# Patient Record
Sex: Male | Born: 1985 | Race: White | Hispanic: No | Marital: Single | Smoking: Never smoker
Health system: Southern US, Community
[De-identification: ages and names within clinical notes are randomized; demographics above are authoritative.]

---

## 2008-01-09 ENCOUNTER — Emergency Department: Payer: Self-pay | Admitting: Emergency Medicine

## 2021-01-27 ENCOUNTER — Other Ambulatory Visit: Payer: Self-pay

## 2021-01-27 DIAGNOSIS — U071 COVID-19: Secondary | ICD-10-CM | POA: Insufficient documentation

## 2021-01-27 DIAGNOSIS — Z5321 Procedure and treatment not carried out due to patient leaving prior to being seen by health care provider: Secondary | ICD-10-CM | POA: Diagnosis not present

## 2021-01-27 DIAGNOSIS — R059 Cough, unspecified: Secondary | ICD-10-CM | POA: Diagnosis present

## 2021-01-27 LAB — RESP PANEL BY RT-PCR (FLU A&B, COVID) ARPGX2
Influenza A by PCR: NEGATIVE
Influenza B by PCR: NEGATIVE
SARS Coronavirus 2 by RT PCR: POSITIVE — AB

## 2021-01-27 NOTE — ED Triage Notes (Signed)
Chills, headache, vomit last night x1, 100.4 tmax. Tylenol taken and 'messing with ulcers.' Wife +covid wednesday

## 2021-01-28 ENCOUNTER — Emergency Department
Admission: EM | Admit: 2021-01-28 | Discharge: 2021-01-28 | Disposition: A | Discharge status: Home / Self Care | Payer: Medicaid - Out of State | Attending: Emergency Medicine | Admitting: Emergency Medicine

## 2021-01-28 NOTE — ED Notes (Signed)
Pt called for repeat VS with no answer.  

## 2021-01-29 ENCOUNTER — Other Ambulatory Visit: Payer: Self-pay

## 2021-01-29 ENCOUNTER — Emergency Department
Admission: EM | Admit: 2021-01-29 | Discharge: 2021-01-29 | Disposition: A | Discharge status: Home / Self Care | Payer: Medicaid - Out of State | Attending: Student in an Organized Health Care Education/Training Program | Admitting: Student in an Organized Health Care Education/Training Program

## 2021-01-29 DIAGNOSIS — U071 COVID-19: Secondary | ICD-10-CM

## 2021-01-29 DIAGNOSIS — R519 Headache, unspecified: Secondary | ICD-10-CM

## 2021-01-29 MED ORDER — DIPHENHYDRAMINE HCL 25 MG PO CAPS
25.0000 mg | ORAL_CAPSULE | Freq: Once | ORAL | Status: AC
  Administered 2021-01-29: 25 mg via ORAL
  Filled 2021-01-29: qty 1

## 2021-01-29 MED ORDER — METOCLOPRAMIDE HCL 10 MG PO TABS
10.0000 mg | ORAL_TABLET | Freq: Once | ORAL | Status: AC
  Administered 2021-01-29: 10 mg via ORAL
  Filled 2021-01-29: qty 1

## 2021-01-29 MED ORDER — DEXAMETHASONE SODIUM PHOSPHATE 10 MG/ML IJ SOLN
10.0000 mg | Freq: Once | INTRAMUSCULAR | Status: AC
  Administered 2021-01-29: 10 mg via INTRAMUSCULAR
  Filled 2021-01-29: qty 1

## 2021-01-29 MED ORDER — METOCLOPRAMIDE HCL 5 MG PO TABS: 5.0000 mg | ORAL_TABLET | Freq: Three times a day (TID) | ORAL | 0 refills | Status: DC | PRN

## 2021-01-29 MED ORDER — NIRMATRELVIR/RITONAVIR (PAXLOVID)TABLET: 3.0000 | ORAL_TABLET | Freq: Two times a day (BID) | ORAL | 0 refills | Status: AC

## 2021-01-29 NOTE — Discharge Instructions (Addendum)
Take the meds as prescribed. Follow-up with your provider or return if needed.

## 2021-01-29 NOTE — ED Triage Notes (Signed)
Pt states that he was seen here Friday for a head ache and LWBS- pt states he was tested for covid and never got his results- pt chart states his swab results were positive for covid- pt here for HA today as HA has not gotten better since Friday- pt states he took tylenol about 15 minutes prior to arrival

## 2021-01-31 NOTE — ED Provider Notes (Signed)
[facility] Emergency Department Provider Note ____________________________________________  Time seen: 2250  I have reviewed the triage vital signs and the nursing notes.  HISTORY  Chief Complaint  Covid Positive   HPI Philip Ellis is a 35 y.o. male presents with to the ED for evaluation of symptoms related to recently diagnosed COVID.  Patient was seen in the ED 2 days ago for headache, and left before being seen.  He apparently had a pending COVID test has been reported back as positive.  He presents today noting ongoing headache as well as some generalized malaise and body aches.  He denies any nausea, vomiting, chest pain, or shortness of breath.  History reviewed. No pertinent past medical history.  There are no problems to display for this patient.   History reviewed. No pertinent surgical history.  Prior to Admission medications   Medication Sig Start Date End Date Taking? Authorizing Provider  metoCLOPramide (REGLAN) 5 MG tablet Take 1 tablet (5 mg total) by mouth every 8 (eight) hours as needed for up to 5 days for nausea or vomiting. 01/29/21 02/03/21 Yes Jessina Marse, Charlesetta Ivory, PA-C  nirmatrelvir/ritonavir EUA (PAXLOVID) 20 x 150 MG & 10 x 100MG  TABS Take 3 tablets by mouth 2 (two) times daily for 5 days. Patient GFR is WNL. Take nirmatrelvir (150 mg) two tablets twice daily for 5 days and ritonavir (100 mg) one tablet twice daily for 5 days. 01/29/21 02/03/21 Yes Arline Ketter, 02/05/21, PA-C    Allergies Patient has no known allergies.  History reviewed. No pertinent family history.  Social History    Review of Systems  Constitutional: Negative for fever. Eyes: Negative for visual changes. ENT: Negative for sore throat. Cardiovascular: Negative for chest pain. Respiratory: Negative for shortness of breath. Gastrointestinal: Negative for abdominal pain, vomiting and diarrhea. Genitourinary: Negative for dysuria. Musculoskeletal: Negative  for back pain. Skin: Negative for rash. Neurological: Positive for headaches.  Denies focal weakness or numbness. ____________________________________________  PHYSICAL EXAM:  VITAL SIGNS: ED Triage Vitals  Enc Vitals Group     BP 01/29/21 1827 108/73     Pulse Rate 01/29/21 1827 68     Resp 01/29/21 1827 18     Temp 01/29/21 1827 98.7 F (37.1 C)     Temp Source 01/29/21 1827 Oral     SpO2 01/29/21 1827 99 %     Weight 01/29/21 1829 153 lb (69.4 kg)     Height 01/29/21 1829 6\' 3"  (1.905 m)     Head Circumference --      Peak Flow --      Pain Score 01/29/21 1829 7     Pain Loc --      Pain Edu? --      Excl. in GC? --     Constitutional: Alert and oriented. Well appearing and in no distress. Head: Normocephalic and atraumatic. Eyes: Conjunctivae are normal. PERRL. Normal extraocular movements Ears: Canals clear. TMs intact bilaterally. Nose: No congestion/rhinorrhea/epistaxis. Mouth/Throat: Mucous membranes are moist. Neck: Supple. No thyromegaly. Hematological/Lymphatic/Immunological: No cervical lymphadenopathy. Cardiovascular: Normal rate, regular rhythm. Normal distal pulses. Respiratory: Normal respiratory effort. No wheezes/rales/rhonchi. Gastrointestinal: Soft and nontender. No distention. Musculoskeletal: Nontender with normal range of motion in all extremities.  Neurologic:  Normal gait without ataxia. Normal speech and language. No gross focal neurologic deficits are appreciated. Skin:  Skin is warm, dry and intact. No rash noted. Psychiatric: Mood and affect are normal. Patient exhibits appropriate insight and judgment. ____________________________________________    {LABS (  pertinent positives/negatives) Labs Reviewed - No data to display  ____________________________________________  {EKG  ____________________________________________   RADIOLOGY Official radiology report(s): No results  found. ____________________________________________  PROCEDURES  Benadryl 25 mg p.o. Metoclopramide 10 mg p.o. Decadron IM 10 mg  Procedures ____________________________________________   INITIAL IMPRESSION / ASSESSMENT AND PLAN / ED COURSE  As part of my medical decision making, I reviewed the following data within the electronic MEDICAL RECORD NUMBER Labs reviewed as noted and Notes from prior ED visits   Patient recently confirmed COVID, presents with ongoing headache and malaise.  He is evaluated for his complaints, and found to have no acute findings on exam.  No respiratory distress, no dehydration, no toxic appearance.  No neuromuscular deficits on exam.  He is treated with antihistamines, nausea medicine, and steroid in the ED.  He is discharged with a prescription for Paxlovid to take as directed.  He will follow with primary provider return to the ED if needed.  Rockie Schnoor was evaluated in Emergency Department on 01/31/2021 for the symptoms described in the history of present illness. He was evaluated in the context of the global COVID-19 pandemic, which necessitated consideration that the patient might be at risk for infection with the SARS-CoV-2 virus that causes COVID-19. Institutional protocols and algorithms that pertain to the evaluation of patients at risk for COVID-19 are in a state of rapid change based on information released by regulatory bodies including the CDC and federal and state organizations. These policies and algorithms were followed during the patient's care in the ED. ____________________________________________  FINAL CLINICAL IMPRESSION(S) / ED DIAGNOSES  Final diagnoses:  COVID-19  Acute nonintractable headache, unspecified headache type      Lissa Hoard, PA-C 01/31/21 2035    Willy Eddy, MD 02/01/21 [phone]

## 2021-06-15 ENCOUNTER — Emergency Department
Admission: EM | Admit: 2021-06-15 | Discharge: 2021-06-15 | Disposition: A | Discharge status: Home / Self Care | Payer: Medicaid - Out of State | Attending: Emergency Medicine | Admitting: Emergency Medicine

## 2021-06-15 ENCOUNTER — Other Ambulatory Visit: Payer: Self-pay

## 2021-06-15 DIAGNOSIS — J069 Acute upper respiratory infection, unspecified: Secondary | ICD-10-CM | POA: Insufficient documentation

## 2021-06-15 DIAGNOSIS — Z20822 Contact with and (suspected) exposure to covid-19: Secondary | ICD-10-CM | POA: Insufficient documentation

## 2021-06-15 DIAGNOSIS — R059 Cough, unspecified: Secondary | ICD-10-CM | POA: Diagnosis present

## 2021-06-15 LAB — RESP PANEL BY RT-PCR (FLU A&B, COVID) ARPGX2
Influenza A by PCR: NEGATIVE
Influenza B by PCR: NEGATIVE
SARS Coronavirus 2 by RT PCR: NEGATIVE

## 2021-06-15 LAB — GROUP A STREP BY PCR: Group A Strep by PCR: NOT DETECTED

## 2021-06-15 MED ORDER — PSEUDOEPH-BROMPHEN-DM 30-2-10 MG/5ML PO SYRP: 5.0000 mL | ORAL_SOLUTION | Freq: Four times a day (QID) | ORAL | 0 refills | Status: DC | PRN

## 2021-06-15 MED ORDER — BENZONATATE 100 MG PO CAPS: ORAL_CAPSULE | ORAL | 0 refills | Status: DC

## 2021-06-15 NOTE — ED Triage Notes (Signed)
Pt c/o cough with congestion, runny nose, sore throat for the past 3 days, states he took a home covid test that was negative

## 2021-06-15 NOTE — ED Notes (Signed)
See triage note  presents with cough and congestion  sx's started a few days ago afebrile on arrival states home COVID was neg

## 2021-06-15 NOTE — Discharge Instructions (Addendum)
Your tests and exam are reassuring at this time.  Take the prescription meds as needed.  Follow with your primary provider or return to ED as needed.

## 2021-06-15 NOTE — ED Provider Notes (Signed)
[facility] Provider Note  Patient Contact: 10:58 AM (approximate)   History   URI   HPI  Philip Ellis is a 36 y.o. male presents to the ED for evaluation of cough, congestion, runny nose, sore throat for the last 3 days.  He took a home COVID test that was negative.     Physical Exam   Triage Vital Signs: ED Triage Vitals  Enc Vitals Group     BP 06/15/21 0905 (!) 131/93     Pulse Rate 06/15/21 0905 77     Resp 06/15/21 0905 18     Temp 06/15/21 0905 98.7 F (37.1 C)     Temp src --      SpO2 06/15/21 0905 98 %     Weight 06/15/21 0918 153 lb (69.4 kg)     Height 06/15/21 0918 6\' 3"  (1.905 m)     Head Circumference --      Peak Flow --      Pain Score --      Pain Loc --      Pain Edu? --      Excl. in GC? --     Most recent vital signs: Vitals:   06/15/21 0905  BP: (!) 131/93  Pulse: 77  Resp: 18  Temp: 98.7 F (37.1 C)  SpO2: 98%     General: Alert and in no acute distress. Head: No acute traumatic findings Ears: TMs clear bilaterally Nose: No congestion/rhinnorhea. Mouth/Throat: Mucous membranes are moist. Neck: No stridor. No cervical spine tenderness to palpation. Cardiovascular:  Good peripheral perfusion Respiratory: Normal respiratory effort without tachypnea or retractions. Lungs CTAB.  Musculoskeletal: Full range of motion to all extremities.  Neurologic:  No gross focal neurologic deficits are appreciated.  Skin:   No rash noted Other:   ED Results / Procedures / Treatments   Labs (all labs ordered are listed, but only abnormal results are displayed) Labs Reviewed  RESP PANEL BY RT-PCR (FLU A&B, COVID) [id number]  GROUP A STREP BY PCR     EKG    RADIOLOGY   No results found.  PROCEDURES:  Critical Care performed: No  Procedures   MEDICATIONS ORDERED IN ED: Medications - No data to display   IMPRESSION / MDM / ASSESSMENT AND PLAN / ED COURSE  I reviewed the triage vital signs and the  nursing notes.                              Differential diagnosis includes, but is not limited to, viral URI, strep pharyngitis, COVID, flu, AOM  Patient ED evaluation of symptoms including cough, congestion, and sore throat, presenting for evaluation.  He is evaluated in the ED with a reassuring exam shows no acute AOM or tonsillitis.  Labs including viral panel screen are negative at this time, and a prescription strep does not confirm strep pharyngitis.  Patient's diagnosis is consistent with viral URI with cough. Patient will be discharged home with prescriptions for Tessalon Perles and Bromfed syrup. Patient is to follow up with local urgent care as needed or otherwise directed. Patient is given ED precautions to return to the ED for any worsening or new symptoms.   FINAL CLINICAL IMPRESSION(S) / ED DIAGNOSES   Final diagnoses:  Viral URI with cough     Rx / DC Orders   ED Discharge Orders          Ordered  benzonatate (TESSALON PERLES) 100 MG capsule        06/15/21 1108    brompheniramine-pseudoephedrine-DM 30-2-10 MG/5ML syrup  4 times daily PRN        06/15/21 1108             Note:  This document was prepared using Dragon voice recognition software and may include unintentional dictation errors.    Lissa Hoard, PA-C 06/17/21 2309    Minna Antis, MD 06/19/21 [phone]

## 2021-08-16 ENCOUNTER — Other Ambulatory Visit: Payer: Self-pay

## 2021-08-16 ENCOUNTER — Emergency Department
Admission: EM | Admit: 2021-08-16 | Discharge: 2021-08-16 | Disposition: A | Discharge status: Home / Self Care | Payer: Medicaid - Out of State | Attending: Student in an Organized Health Care Education/Training Program | Admitting: Student in an Organized Health Care Education/Training Program

## 2021-08-16 ENCOUNTER — Encounter: Payer: Self-pay | Admitting: Emergency Medicine

## 2021-08-16 ENCOUNTER — Emergency Department: Payer: Medicaid - Out of State

## 2021-08-16 DIAGNOSIS — W228XXA Striking against or struck by other objects, initial encounter: Secondary | ICD-10-CM | POA: Insufficient documentation

## 2021-08-16 DIAGNOSIS — S60221A Contusion of right hand, initial encounter: Secondary | ICD-10-CM | POA: Diagnosis not present

## 2021-08-16 DIAGNOSIS — S6991XA Unspecified injury of right wrist, hand and finger(s), initial encounter: Secondary | ICD-10-CM | POA: Diagnosis present

## 2021-08-16 MED ORDER — IBUPROFEN 800 MG PO TABS: 800.0000 mg | ORAL_TABLET | Freq: Three times a day (TID) | ORAL | 0 refills | Status: AC | PRN

## 2021-08-16 NOTE — ED Triage Notes (Signed)
Pt comes into the ED via POV c/o right hand pain after the wrench slipped and he hit it on the transmission.  Pt in NAD at this time.  ?

## 2021-08-16 NOTE — Discharge Instructions (Addendum)
primary care provider or urgent care if any continued problems.  Ice and elevation to reduce swelling and help with pain.  Ibuprofen 3 times a day with food.  Wear Ace wrap for the next 2 days for support and protection. ?

## 2021-08-16 NOTE — ED Provider Notes (Signed)
? ?[facility] ?Provider Note ? ? ? Event Date/Time  ? First MD Initiated Contact with Patient 08/16/21 1225   ?  (approximate) ? ? ?History  ? ?Hand Pain ? ? ?HPI ? ?Philip Ellis is a 36 y.o. male to the ED with with complaint of right hand pain.  Patient states that he was hit with a wrench that slipped yesterday and he hit his hand on the transmission.  Today it is swollen and tender.  Patient took ibuprofen 200 mg last evening without any relief.  No medical history.  Pain is 6/10. ?  ? ? ?Physical Exam  ? ?Triage Vital Signs: ?ED Triage Vitals  ?Enc Vitals Group  ?   BP 08/16/21 1159 119/87  ?   Pulse Rate 08/16/21 1159 70  ?   Resp 08/16/21 1159 16  ?   Temp 08/16/21 1159 98.2 ?F (36.8 ?C)  ?   Temp Source 08/16/21 1159 Oral  ?   SpO2 08/16/21 1159 99 %  ?   Weight 08/16/21 1158 153 lb (69.4 kg)  ?   Height 08/16/21 1158 6\' 3"  (1.905 m)  ?   Head Circumference --   ?   Peak Flow --   ?   Pain Score 08/16/21 1157 6  ?   Pain Loc --   ?   Pain Edu? --   ?   Excl. in GC? --   ? ? ?Most recent vital signs: ?Vitals:  ? 08/16/21 1159  ?BP: 119/87  ?Pulse: 70  ?Resp: 16  ?Temp: 98.2 ?F (36.8 ?C)  ?SpO2: 99%  ? ? ? ?General: Awake, no distress.  ?CV:  Good peripheral perfusion.  Heart regular rate and rhythm. ?Resp:  Normal effort.  Lungs bilaterally are clear. ?Abd:  No distention.  ?Other:  Examination of the right hand shows no gross deformity.  Minimal soft tissue edema but marked tenderness on palpation of the fourth MP joint and distal metacarpal area.  No skin abrasions or open wounds.  Capillary refills less than 3 seconds.  Patient is able to flex and extend but is limited secondary to pain. ? ? ?ED Results / Procedures / Treatments  ? ?Labs ?(all labs ordered are listed, but only abnormal results are displayed) ?Labs Reviewed - No data to display ? ? ? ?RADIOLOGY ?Right hand x-ray read viewed by me independently of the radiologist and was negative for fracture.  Radiology report is  negative ? ? ? ?PROCEDURES: ? ?Critical Care performed:  ? ?Procedures ? ? ?MEDICATIONS ORDERED IN ED: ?Medications - No data to display ? ? ?IMPRESSION / MDM / ASSESSMENT AND PLAN / ED COURSE  ?I reviewed the triage vital signs and the nursing notes. ? ? ?Differential diagnosis includes, but is not limited to, fractured right hand, contusion right hand. ? ? ?36 year old male presents to the ED after he injured his right hand yesterday when his wrench slipped while working on a transmission.  He has had increased pain today and is unrelieved by ibuprofen 200 mg.  On exam there is tenderness on palpation of the fourth MP joint and distal metacarpal area.  Patient was made aware that xrays were negative for fracture.  Ace wrap was applied and ibuprofen 800mg  with food was sent to the pharmacy.  Ice and elevation.   ? ? ?  ? ? ?FINAL CLINICAL IMPRESSION(S) / ED DIAGNOSES  ? ?Final diagnoses:  ?Contusion of right hand, initial encounter  ? ? ? ?Rx /  DC Orders  ? ?ED Discharge Orders   ? ?      Ordered  ?  ibuprofen (ADVIL) 800 MG tablet  Every 8 hours PRN       ? 08/16/21 1239  ? ?  ?  ? ?  ? ? ? ?Note:  This document was prepared using Dragon voice recognition software and may include unintentional dictation errors. ?  ?Tommi Rumps, PA-C ?08/16/21 1246 ? ?  ?Willy Eddy, MD ?08/16/21 1346 ? ?

## 2023-03-17 IMAGING — DX DG HAND COMPLETE 3+V*R*
3 series · 3 of 3 positions shown · non-contrast
Comparison: None.

CLINICAL DATA: Pain after acute injury.

EXAM:
RIGHT HAND - COMPLETE 3+ VIEW

[hand ap (1 of 2)]
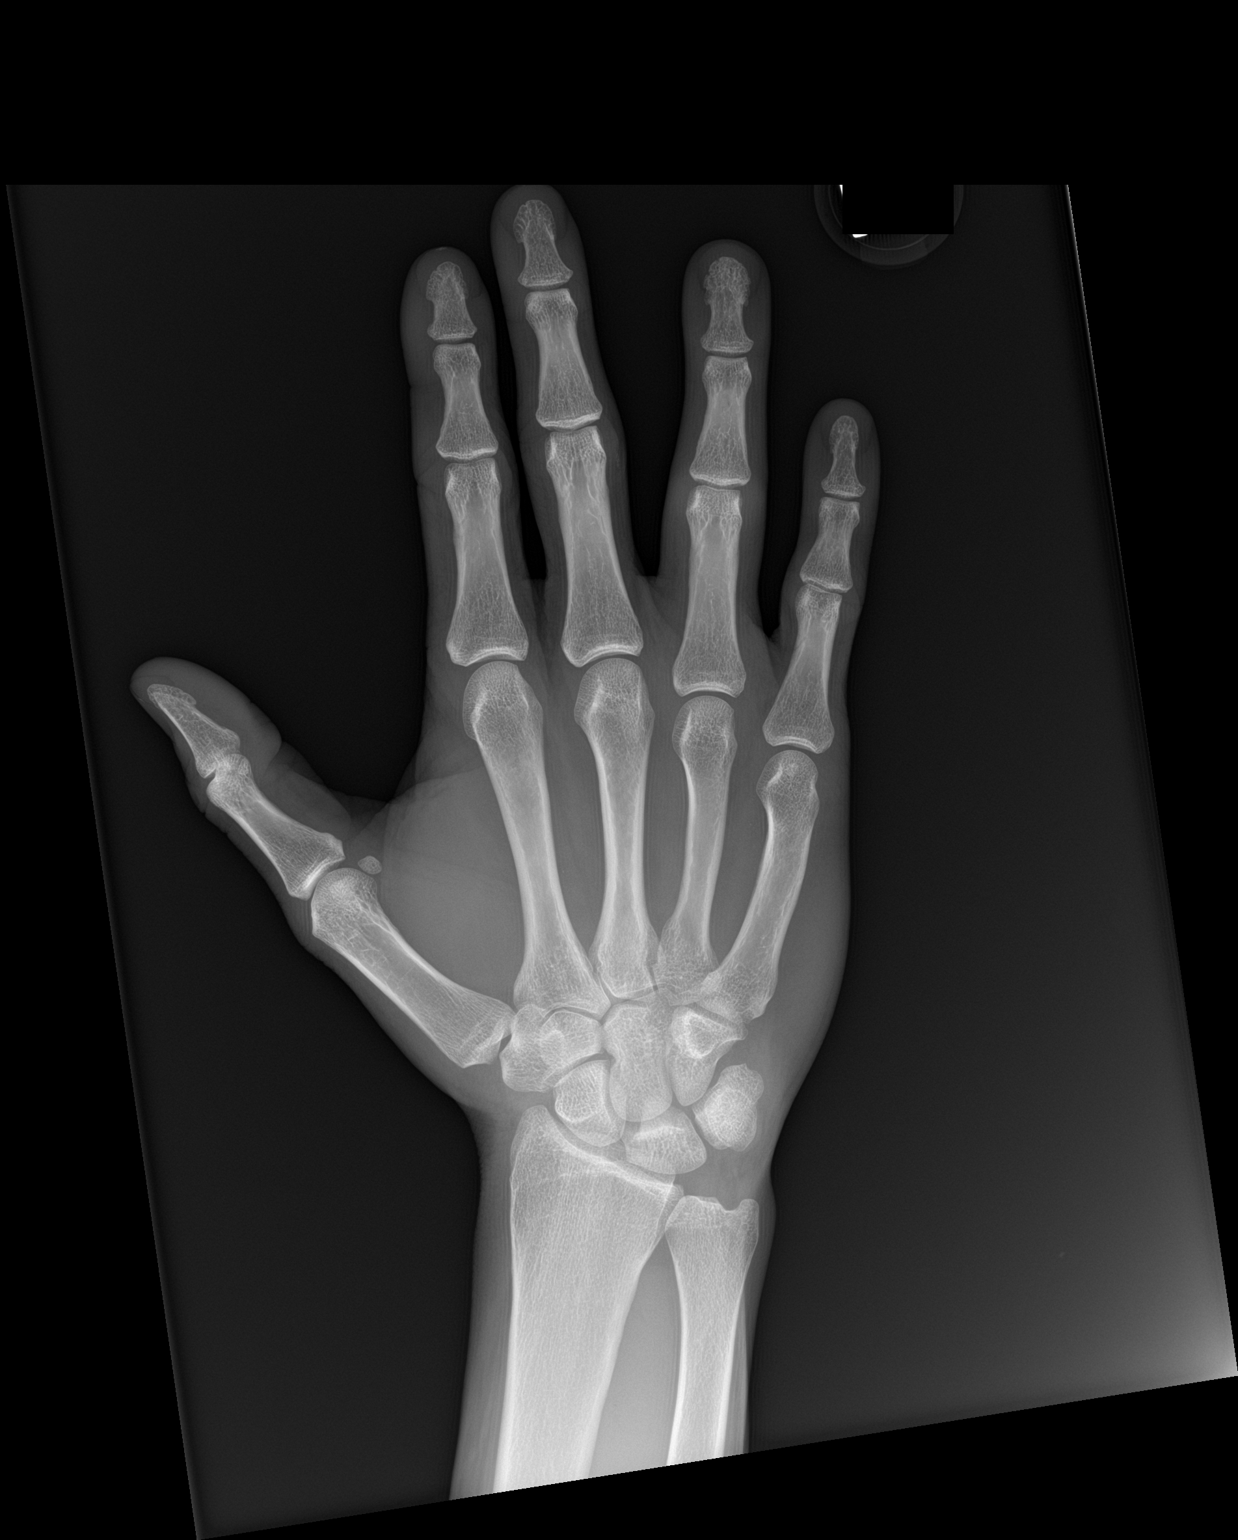

[hand lat]
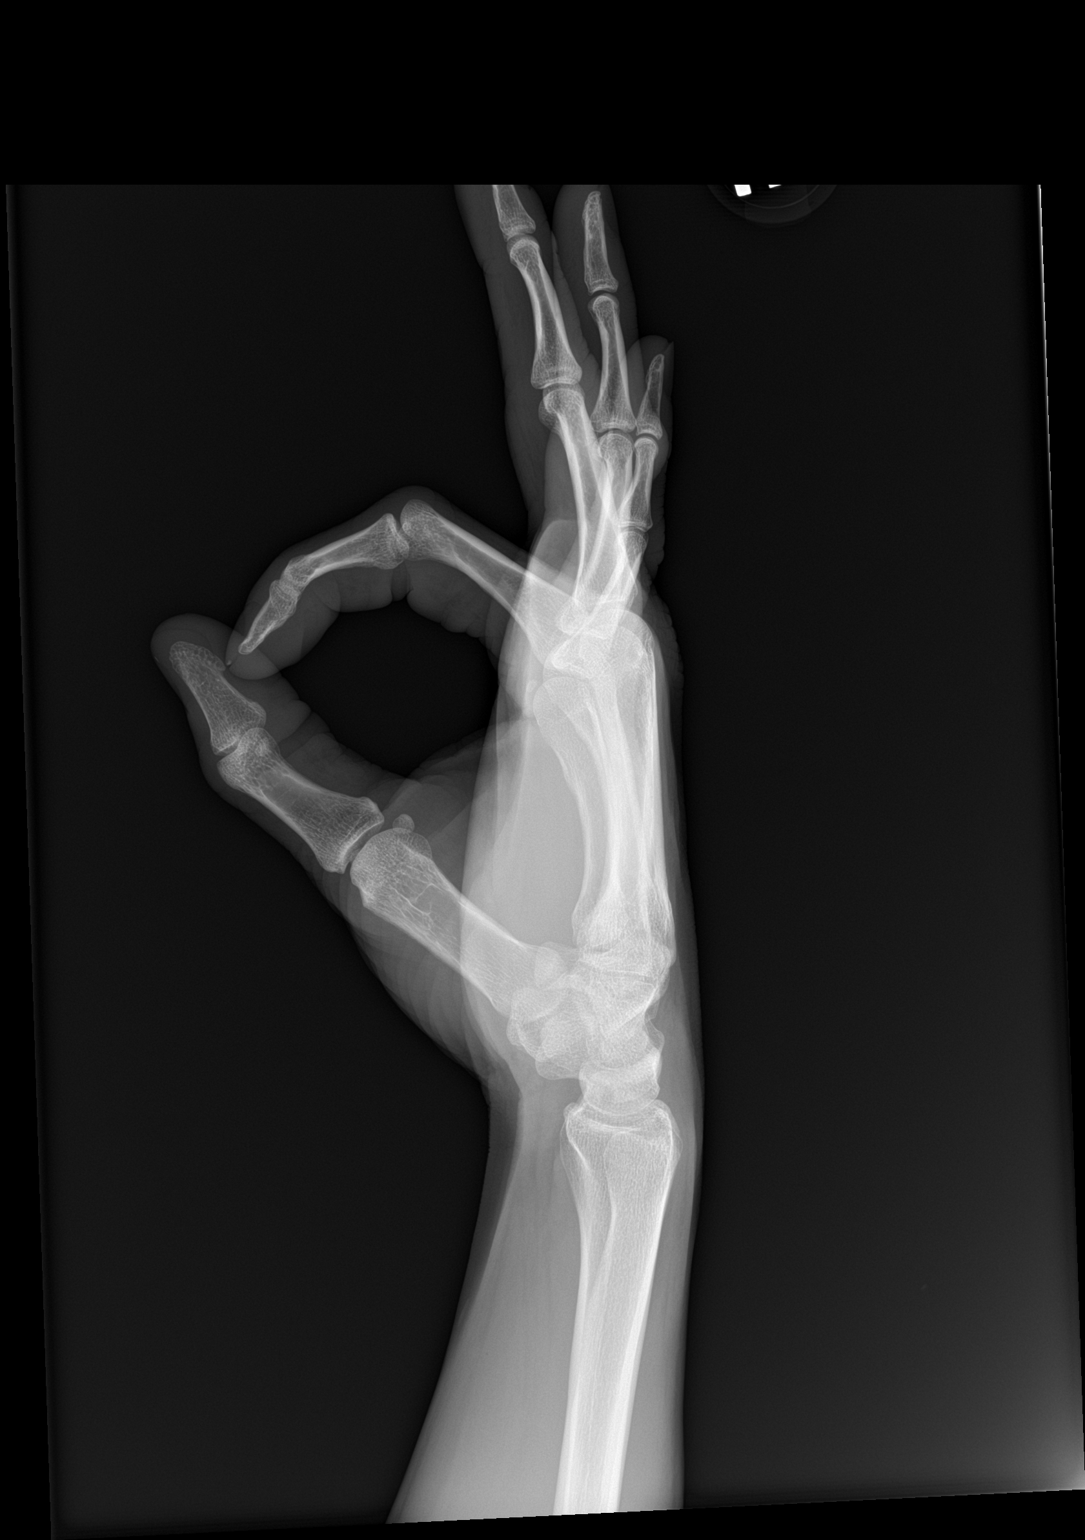

[hand ap (2 of 2)]
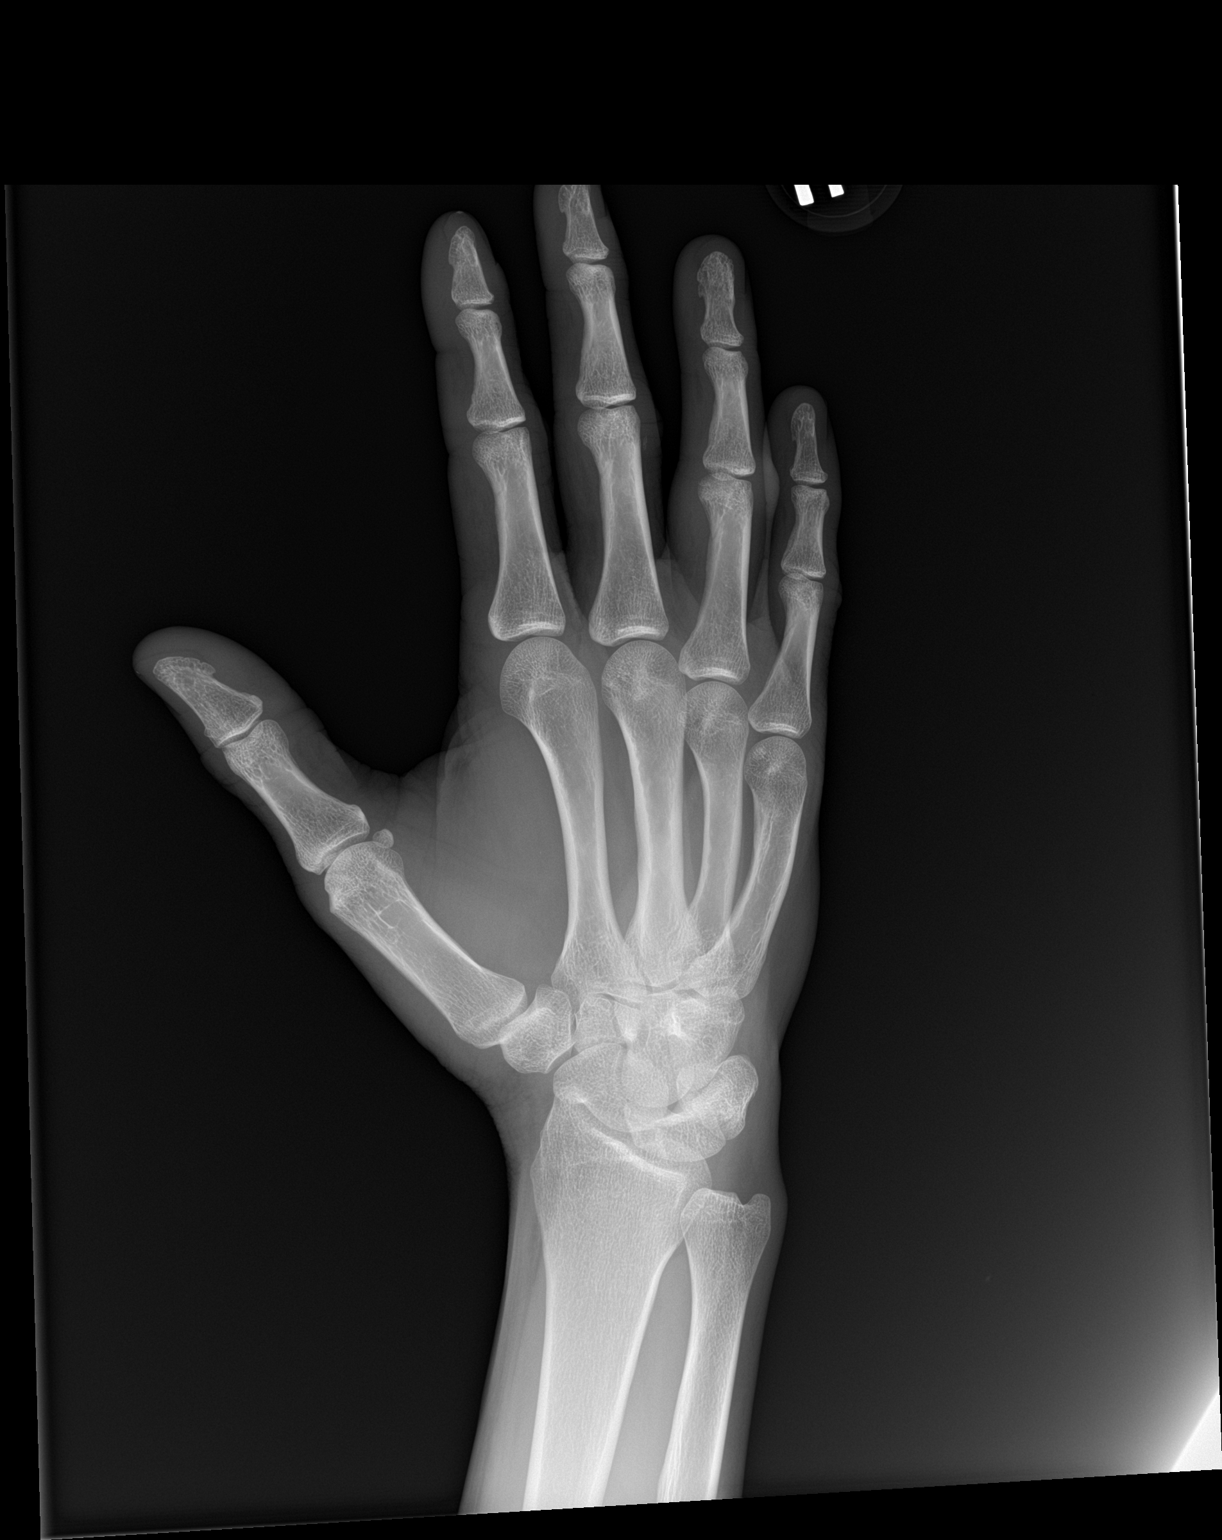

[3 of 3 positions shown; findings below may reference images not displayed]

FINDINGS: There is no evidence of fracture or dislocation. There is no
evidence of arthropathy or other focal bone abnormality. Soft
tissues are unremarkable.
IMPRESSION: Negative.
